# Patient Record
Sex: Female | Born: 1975 | Race: White | Hispanic: No | Marital: Single | Smoking: Current every day smoker
Health system: Southern US, Community
[De-identification: ages and names within clinical notes are randomized; demographics above are authoritative.]

## PROBLEM LIST (undated history)

## (undated) DIAGNOSIS — D649 Anemia, unspecified: Secondary | ICD-10-CM

## (undated) HISTORY — PX: WISDOM TOOTH EXTRACTION: SHX21

## (undated) HISTORY — PX: HERNIA REPAIR: SHX51

---

## 2018-09-08 ENCOUNTER — Other Ambulatory Visit: Payer: Self-pay

## 2018-09-08 ENCOUNTER — Encounter (HOSPITAL_COMMUNITY): Payer: Self-pay

## 2018-09-08 NOTE — Progress Notes (Signed)
Pre-op phone call complete. Pt denies a cardiac hx or being seen by a cardiologist. All questions and concerns have been addressed at this time.

## 2018-09-09 ENCOUNTER — Encounter (HOSPITAL_COMMUNITY)
Admission: RE | Disposition: A | Discharge status: Home / Self Care | Payer: Self-pay | Source: Ambulatory Visit | Attending: Orthopedic Surgery

## 2018-09-09 ENCOUNTER — Encounter (HOSPITAL_COMMUNITY): Payer: Self-pay

## 2018-09-09 ENCOUNTER — Ambulatory Visit (HOSPITAL_COMMUNITY): Payer: No Typology Code available for payment source

## 2018-09-09 ENCOUNTER — Ambulatory Visit (HOSPITAL_COMMUNITY): Payer: No Typology Code available for payment source | Admitting: Anesthesiology

## 2018-09-09 ENCOUNTER — Observation Stay (HOSPITAL_COMMUNITY)
Admission: RE | Admit: 2018-09-09 | Discharge: 2018-09-11 | Disposition: A | Discharge status: Home / Self Care | Payer: No Typology Code available for payment source | Source: Ambulatory Visit | Attending: Orthopedic Surgery | Admitting: Orthopedic Surgery

## 2018-09-09 DIAGNOSIS — S82122A Displaced fracture of lateral condyle of left tibia, initial encounter for closed fracture: Secondary | ICD-10-CM | POA: Diagnosis present

## 2018-09-09 DIAGNOSIS — Z79899 Other long term (current) drug therapy: Secondary | ICD-10-CM | POA: Diagnosis not present

## 2018-09-09 DIAGNOSIS — X58XXXA Exposure to other specified factors, initial encounter: Secondary | ICD-10-CM | POA: Diagnosis not present

## 2018-09-09 DIAGNOSIS — Y990 Civilian activity done for income or pay: Secondary | ICD-10-CM | POA: Diagnosis not present

## 2018-09-09 DIAGNOSIS — Z7982 Long term (current) use of aspirin: Secondary | ICD-10-CM | POA: Insufficient documentation

## 2018-09-09 DIAGNOSIS — Z419 Encounter for procedure for purposes other than remedying health state, unspecified: Secondary | ICD-10-CM

## 2018-09-09 DIAGNOSIS — F17210 Nicotine dependence, cigarettes, uncomplicated: Secondary | ICD-10-CM | POA: Diagnosis not present

## 2018-09-09 HISTORY — PX: ORIF TIBIA PLATEAU: SHX2132

## 2018-09-09 HISTORY — DX: Anemia, unspecified: D64.9

## 2018-09-09 LAB — CBC
HCT: 44.7 % (ref 36.0–46.0)
Hemoglobin: 14.4 g/dL (ref 12.0–15.0)
MCH: 28.7 pg (ref 26.0–34.0)
MCHC: 32.2 g/dL (ref 30.0–36.0)
MCV: 89 fL (ref 78.0–100.0)
Platelets: 292 10*3/uL (ref 150–400)
RBC: 5.02 MIL/uL (ref 3.87–5.11)
RDW: 13.8 % (ref 11.5–15.5)
WBC: 9.8 10*3/uL (ref 4.0–10.5)

## 2018-09-09 LAB — CREATININE, SERUM: CREATININE: 0.64 mg/dL (ref 0.44–1.00)

## 2018-09-09 LAB — HEMOGLOBIN: HEMOGLOBIN: 14.8 g/dL (ref 12.0–15.0)

## 2018-09-09 SURGERY — OPEN REDUCTION INTERNAL FIXATION (ORIF) TIBIAL PLATEAU
Anesthesia: General | Site: Leg Lower | Laterality: Left

## 2018-09-09 MED ORDER — DEXAMETHASONE SODIUM PHOSPHATE 10 MG/ML IJ SOLN
INTRAMUSCULAR | Status: DC | PRN
  Administered 2018-09-09: 10 mg via INTRAVENOUS

## 2018-09-09 MED ORDER — ACETAMINOPHEN 650 MG RE SUPP: 650.0000 mg | Freq: Four times a day (QID) | RECTAL | Status: DC | PRN

## 2018-09-09 MED ORDER — FENTANYL CITRATE (PF) 100 MCG/2ML IJ SOLN
25.0000 ug | INTRAMUSCULAR | Status: DC | PRN
  Administered 2018-09-09 (×2): 50 ug via INTRAVENOUS

## 2018-09-09 MED ORDER — METHOCARBAMOL 1000 MG/10ML IJ SOLN
500.0000 mg | Freq: Four times a day (QID) | INTRAVENOUS | Status: DC | PRN
  Filled 2018-09-09: qty 5

## 2018-09-09 MED ORDER — ONDANSETRON HCL 4 MG PO TABS: 4.0000 mg | ORAL_TABLET | Freq: Four times a day (QID) | ORAL | Status: DC | PRN

## 2018-09-09 MED ORDER — CLINDAMYCIN PHOSPHATE 900 MG/50ML IV SOLN
900.0000 mg | Freq: Once | INTRAVENOUS | Status: AC
  Administered 2018-09-09: 900 mg via INTRAVENOUS
  Filled 2018-09-09: qty 50

## 2018-09-09 MED ORDER — METHOCARBAMOL 500 MG PO TABS
ORAL_TABLET | ORAL | Status: AC
  Filled 2018-09-09: qty 1

## 2018-09-09 MED ORDER — 0.9 % SODIUM CHLORIDE (POUR BTL) OPTIME
TOPICAL | Status: DC | PRN
  Administered 2018-09-09: 1000 mL

## 2018-09-09 MED ORDER — ACETAMINOPHEN 325 MG PO TABS: 650.0000 mg | ORAL_TABLET | Freq: Four times a day (QID) | ORAL | Status: DC | PRN

## 2018-09-09 MED ORDER — KETOROLAC TROMETHAMINE 15 MG/ML IJ SOLN
INTRAMUSCULAR | Status: AC
  Filled 2018-09-09: qty 1

## 2018-09-09 MED ORDER — OXYCODONE HCL 5 MG PO TABS
ORAL_TABLET | ORAL | Status: AC
  Filled 2018-09-09: qty 2

## 2018-09-09 MED ORDER — METHOCARBAMOL 500 MG PO TABS
500.0000 mg | ORAL_TABLET | Freq: Four times a day (QID) | ORAL | Status: DC | PRN
  Administered 2018-09-09 – 2018-09-11 (×5): 500 mg via ORAL
  Filled 2018-09-09 (×4): qty 1

## 2018-09-09 MED ORDER — FENTANYL CITRATE (PF) 100 MCG/2ML IJ SOLN
INTRAMUSCULAR | Status: DC | PRN
  Administered 2018-09-09 (×3): 50 ug via INTRAVENOUS
  Administered 2018-09-09: 100 ug via INTRAVENOUS

## 2018-09-09 MED ORDER — KETOROLAC TROMETHAMINE 15 MG/ML IJ SOLN
15.0000 mg | Freq: Four times a day (QID) | INTRAMUSCULAR | Status: DC
  Administered 2018-09-09 – 2018-09-11 (×8): 15 mg via INTRAVENOUS
  Filled 2018-09-09 (×7): qty 1

## 2018-09-09 MED ORDER — ROCURONIUM BROMIDE 50 MG/5ML IV SOSY
PREFILLED_SYRINGE | INTRAVENOUS | Status: DC | PRN
  Administered 2018-09-09: 50 mg via INTRAVENOUS
  Administered 2018-09-09: 20 mg via INTRAVENOUS

## 2018-09-09 MED ORDER — SUGAMMADEX SODIUM 200 MG/2ML IV SOLN
INTRAVENOUS | Status: DC | PRN
  Administered 2018-09-09: 200 mg via INTRAVENOUS

## 2018-09-09 MED ORDER — PROMETHAZINE HCL 25 MG/ML IJ SOLN: 6.2500 mg | INTRAMUSCULAR | Status: DC | PRN

## 2018-09-09 MED ORDER — ONDANSETRON HCL 4 MG/2ML IJ SOLN
INTRAMUSCULAR | Status: AC
  Filled 2018-09-09: qty 2

## 2018-09-09 MED ORDER — ROCURONIUM BROMIDE 50 MG/5ML IV SOSY
PREFILLED_SYRINGE | INTRAVENOUS | Status: AC
  Filled 2018-09-09: qty 5

## 2018-09-09 MED ORDER — DOCUSATE SODIUM 100 MG PO CAPS
100.0000 mg | ORAL_CAPSULE | Freq: Two times a day (BID) | ORAL | Status: DC
  Administered 2018-09-09 – 2018-09-11 (×4): 100 mg via ORAL
  Filled 2018-09-09 (×4): qty 1

## 2018-09-09 MED ORDER — PROPOFOL 10 MG/ML IV BOLUS
INTRAVENOUS | Status: AC
  Filled 2018-09-09: qty 20

## 2018-09-09 MED ORDER — MIDAZOLAM HCL 2 MG/2ML IJ SOLN
INTRAMUSCULAR | Status: AC
  Filled 2018-09-09: qty 2

## 2018-09-09 MED ORDER — ENOXAPARIN SODIUM 40 MG/0.4ML ~~LOC~~ SOLN
40.0000 mg | SUBCUTANEOUS | Status: DC
  Administered 2018-09-10 – 2018-09-11 (×2): 40 mg via SUBCUTANEOUS
  Filled 2018-09-09 (×2): qty 0.4

## 2018-09-09 MED ORDER — ACETAMINOPHEN 500 MG PO TABS
ORAL_TABLET | ORAL | Status: AC
  Filled 2018-09-09: qty 2

## 2018-09-09 MED ORDER — SCOPOLAMINE 1 MG/3DAYS TD PT72
1.0000 | MEDICATED_PATCH | Freq: Once | TRANSDERMAL | Status: DC
  Administered 2018-09-09: 1.5 mg via TRANSDERMAL

## 2018-09-09 MED ORDER — FENTANYL CITRATE (PF) 250 MCG/5ML IJ SOLN
INTRAMUSCULAR | Status: AC
  Filled 2018-09-09: qty 5

## 2018-09-09 MED ORDER — MIDAZOLAM HCL 5 MG/5ML IJ SOLN
INTRAMUSCULAR | Status: DC | PRN
  Administered 2018-09-09: 2 mg via INTRAVENOUS

## 2018-09-09 MED ORDER — LIDOCAINE 2% (20 MG/ML) 5 ML SYRINGE
INTRAMUSCULAR | Status: DC | PRN
  Administered 2018-09-09: 80 mg via INTRAVENOUS

## 2018-09-09 MED ORDER — MORPHINE SULFATE (PF) 2 MG/ML IV SOLN
2.0000 mg | INTRAVENOUS | Status: DC | PRN
  Administered 2018-09-09 – 2018-09-11 (×2): 2 mg via INTRAVENOUS
  Filled 2018-09-09 (×3): qty 1

## 2018-09-09 MED ORDER — FENTANYL CITRATE (PF) 100 MCG/2ML IJ SOLN
INTRAMUSCULAR | Status: AC
  Filled 2018-09-09: qty 2

## 2018-09-09 MED ORDER — LIDOCAINE 2% (20 MG/ML) 5 ML SYRINGE
INTRAMUSCULAR | Status: AC
  Filled 2018-09-09: qty 5

## 2018-09-09 MED ORDER — PROPOFOL 10 MG/ML IV BOLUS
INTRAVENOUS | Status: DC | PRN
  Administered 2018-09-09: 150 mg via INTRAVENOUS
  Administered 2018-09-09: 30 mg via INTRAVENOUS

## 2018-09-09 MED ORDER — ONDANSETRON HCL 4 MG/2ML IJ SOLN
INTRAMUSCULAR | Status: DC | PRN
  Administered 2018-09-09: 4 mg via INTRAVENOUS

## 2018-09-09 MED ORDER — CHLORHEXIDINE GLUCONATE 4 % EX LIQD: 60.0000 mL | Freq: Once | CUTANEOUS | Status: DC

## 2018-09-09 MED ORDER — GABAPENTIN 300 MG PO CAPS
ORAL_CAPSULE | ORAL | Status: AC
  Administered 2018-09-09: 300 mg via ORAL
  Filled 2018-09-09: qty 1

## 2018-09-09 MED ORDER — GABAPENTIN 300 MG PO CAPS
300.0000 mg | ORAL_CAPSULE | Freq: Once | ORAL | Status: AC
  Administered 2018-09-09: 300 mg via ORAL

## 2018-09-09 MED ORDER — LACTATED RINGERS IV SOLN
INTRAVENOUS | Status: DC
  Administered 2018-09-09 (×2): via INTRAVENOUS

## 2018-09-09 MED ORDER — ACETAMINOPHEN 500 MG PO TABS: 1000.0000 mg | ORAL_TABLET | Freq: Once | ORAL | Status: DC

## 2018-09-09 MED ORDER — SCOPOLAMINE 1 MG/3DAYS TD PT72
MEDICATED_PATCH | TRANSDERMAL | Status: AC
  Administered 2018-09-09: 1.5 mg via TRANSDERMAL
  Filled 2018-09-09: qty 1

## 2018-09-09 MED ORDER — OXYCODONE HCL 5 MG PO TABS
5.0000 mg | ORAL_TABLET | ORAL | Status: DC | PRN
  Administered 2018-09-09 – 2018-09-11 (×12): 10 mg via ORAL
  Filled 2018-09-09 (×11): qty 2

## 2018-09-09 MED ORDER — ACETAMINOPHEN 500 MG PO TABS
1000.0000 mg | ORAL_TABLET | Freq: Four times a day (QID) | ORAL | Status: DC
  Administered 2018-09-09 – 2018-09-11 (×8): 1000 mg via ORAL
  Filled 2018-09-09 (×8): qty 2

## 2018-09-09 MED ORDER — ONDANSETRON HCL 4 MG/2ML IJ SOLN: 4.0000 mg | Freq: Four times a day (QID) | INTRAMUSCULAR | Status: DC | PRN

## 2018-09-09 MED ORDER — DEXAMETHASONE SODIUM PHOSPHATE 10 MG/ML IJ SOLN
INTRAMUSCULAR | Status: AC
  Filled 2018-09-09: qty 1

## 2018-09-09 SURGICAL SUPPLY — 65 items
ALCOHOL 70% 16 OZ (MISCELLANEOUS) ×3 IMPLANT
BANDAGE ACE 6X5 VEL STRL LF (GAUZE/BANDAGES/DRESSINGS) ×3 IMPLANT
BANDAGE ESMARK 6X9 LF (GAUZE/BANDAGES/DRESSINGS) ×1 IMPLANT
BIT DRILL 2.5 X LONG (BIT) ×1
BIT DRILL CALIBR QC 2.8X250 (BIT) ×3 IMPLANT
BIT DRILL X LONG 2.5 (BIT) ×1 IMPLANT
BLADE CLIPPER SURG (BLADE) IMPLANT
BNDG COHESIVE 6X5 TAN STRL LF (GAUZE/BANDAGES/DRESSINGS) ×3 IMPLANT
BNDG ELASTIC 6X10 VLCR STRL LF (GAUZE/BANDAGES/DRESSINGS) ×3 IMPLANT
BNDG ESMARK 6X9 LF (GAUZE/BANDAGES/DRESSINGS) ×3
BONE CHIP PRESERV 40CC PCAN1/2 (Bone Implant) ×3 IMPLANT
COVER SURGICAL LIGHT HANDLE (MISCELLANEOUS) ×3 IMPLANT
DRAPE C-ARM 42X72 X-RAY (DRAPES) ×3 IMPLANT
DRAPE C-ARMOR (DRAPES) ×3 IMPLANT
DRAPE HALF SHEET 40X57 (DRAPES) ×3 IMPLANT
DRAPE IMP U-DRAPE 54X76 (DRAPES) ×6 IMPLANT
DRAPE INCISE IOBAN 66X45 STRL (DRAPES) ×3 IMPLANT
DRAPE ORTHO SPLIT 77X108 STRL (DRAPES) ×4
DRAPE SURG ORHT 6 SPLT 77X108 (DRAPES) ×2 IMPLANT
DRAPE U-SHAPE 47X51 STRL (DRAPES) ×3 IMPLANT
DRILL BIT X LONG 2.5 (BIT) ×2
DURAPREP 26ML APPLICATOR (WOUND CARE) ×3 IMPLANT
ELECT REM PT RETURN 9FT ADLT (ELECTROSURGICAL) ×3
ELECTRODE REM PT RTRN 9FT ADLT (ELECTROSURGICAL) ×1 IMPLANT
FACESHIELD STD STERILE (MASK) ×3 IMPLANT
GAUZE SPONGE 4X4 12PLY STRL (GAUZE/BANDAGES/DRESSINGS) ×3 IMPLANT
GAUZE SPONGE 4X4 12PLY STRL LF (GAUZE/BANDAGES/DRESSINGS) ×3 IMPLANT
GAUZE XEROFORM 1X8 LF (GAUZE/BANDAGES/DRESSINGS) ×3 IMPLANT
GLOVE BIO SURGEON STRL SZ7.5 (GLOVE) ×3 IMPLANT
GLOVE BIOGEL PI IND STRL 8 (GLOVE) ×1 IMPLANT
GLOVE BIOGEL PI INDICATOR 8 (GLOVE) ×2
GOWN STRL REUS W/ TWL LRG LVL3 (GOWN DISPOSABLE) ×2 IMPLANT
GOWN STRL REUS W/ TWL XL LVL3 (GOWN DISPOSABLE) ×1 IMPLANT
GOWN STRL REUS W/TWL LRG LVL3 (GOWN DISPOSABLE) ×4
GOWN STRL REUS W/TWL XL LVL3 (GOWN DISPOSABLE) ×2
IMMOBILIZER KNEE 22 (SOFTGOODS) ×3 IMPLANT
KIT BASIN OR (CUSTOM PROCEDURE TRAY) ×3 IMPLANT
KIT TURNOVER KIT B (KITS) ×3 IMPLANT
MANIFOLD NEPTUNE II (INSTRUMENTS) ×3 IMPLANT
NDL SUT 6 .5 CRC .975X.05 MAYO (NEEDLE) IMPLANT
NEEDLE MAYO TAPER (NEEDLE)
NS IRRIG 1000ML POUR BTL (IV SOLUTION) ×3 IMPLANT
PACK GENERAL/GYN (CUSTOM PROCEDURE TRAY) ×3 IMPLANT
PAD ABD 8X10 STRL (GAUZE/BANDAGES/DRESSINGS) ×3 IMPLANT
PAD ARMBOARD 7.5X6 YLW CONV (MISCELLANEOUS) ×6 IMPLANT
PADDING CAST COTTON 6X4 STRL (CAST SUPPLIES) ×3 IMPLANT
PLATE PROX TIB LT 3.5 VA-LCP (Plate) ×3 IMPLANT
SCREW CORT ST STARDR 3.5X42 (Screw) ×3 IMPLANT
SCREW CORTEX LP 3.5X34MM (Screw) ×3 IMPLANT
SCREW HEADED ST LCP 3.5X80 (Screw) ×3 IMPLANT
SCREW LOCKING 3.5X80MM VA (Screw) ×3 IMPLANT
SCREW LOCKING VA 3.5X75MM (Screw) ×3 IMPLANT
SCREW VA-LOCKING 65MM 3.5 (Screw) ×3 IMPLANT
STAPLER VISISTAT 35W (STAPLE) ×6 IMPLANT
SUCTION FRAZIER TIP 10 FR DISP (SUCTIONS) ×3 IMPLANT
SUT ETHILON 2 0 FS 18 (SUTURE) IMPLANT
SUT ETHILON 3 0 PS 1 (SUTURE) IMPLANT
SUT PDS AB 0 CT 36 (SUTURE) IMPLANT
SUT VIC AB 0 CT1 27 (SUTURE) ×6
SUT VIC AB 0 CT1 27XBRD ANBCTR (SUTURE) ×3 IMPLANT
SUT VIC AB 2-0 CT1 27 (SUTURE)
SUT VIC AB 2-0 CT1 TAPERPNT 27 (SUTURE) IMPLANT
SYR 3ML LL SCALE MARK (SYRINGE) ×3 IMPLANT
TOWEL OR 17X24 6PK STRL BLUE (TOWEL DISPOSABLE) ×3 IMPLANT
TOWEL OR 17X26 10 PK STRL BLUE (TOWEL DISPOSABLE) ×6 IMPLANT

## 2018-09-09 NOTE — Anesthesia Procedure Notes (Signed)
Procedure Name: Intubation Date/Time: 09/09/2018 10:04 AM Performed by: Scheryl Darter, CRNA Pre-anesthesia Checklist: Patient identified, Emergency Drugs available, Suction available and Patient being monitored Patient Re-evaluated:Patient Re-evaluated prior to induction Oxygen Delivery Method: Circle System Utilized Preoxygenation: Pre-oxygenation with 100% oxygen Induction Type: IV induction Ventilation: Mask ventilation without difficulty Laryngoscope Size: Mac and 3 Grade View: Grade I Tube type: Oral Tube size: 7.0 mm Number of attempts: 1 Airway Equipment and Method: Stylet Placement Confirmation: ETT inserted through vocal cords under direct vision,  positive ETCO2 and breath sounds checked- equal and bilateral Secured at: 23 cm Tube secured with: Tape Dental Injury: Teeth and Oropharynx as per pre-operative assessment  Comments: Intubated by [facility]

## 2018-09-09 NOTE — Plan of Care (Signed)
  Problem: Nutrition: Goal: Adequate nutrition will be maintained Outcome: Progressing   Problem: Coping: Goal: Level of anxiety will decrease Outcome: Progressing   Problem: Elimination: Goal: Will not experience complications related to bowel motility Outcome: Progressing   Problem: Pain Managment: Goal: General experience of comfort will improve Outcome: Progressing   Problem: Safety: Goal: Ability to remain free from injury will improve Outcome: Progressing   

## 2018-09-09 NOTE — Brief Op Note (Signed)
09/09/2018  11:39 AM  PATIENT:  Maria Savage  42 y.o. female  PRE-OPERATIVE DIAGNOSIS:  Left lateral tibial plateau fracture  POST-OPERATIVE DIAGNOSIS:  Left lateral tibial plateau fracture  PROCEDURE:  Procedure(s): OPEN REDUCTION INTERNAL FIXATION (ORIF) TIBIAL PLATEAU (Left)  SURGEON:  Surgeon(s) and Role:    Yolonda Kida* Gurbani Figge Patrick, MD - Primary  PHYSICIAN ASSISTANT:   ASSISTANTS: April Green , RNFA    ANESTHESIA:   general  EBL:  25 mL   BLOOD ADMINISTERED:none  DRAINS: none   LOCAL MEDICATIONS USED:  NONE  SPECIMEN:  No Specimen  DISPOSITION OF SPECIMEN:  N/A  COUNTS:  YES  TOURNIQUET:  * Missing tourniquet times found for documented tourniquets in log: [id number] *  DICTATION: .Note written in EPIC  PLAN OF CARE: Admit for overnight observation  PATIENT DISPOSITION:  PACU - hemodynamically stable.   Delay start of Pharmacological VTE agent (>24hrs) due to surgical blood loss or risk of bleeding: not applicable

## 2018-09-09 NOTE — Anesthesia Postprocedure Evaluation (Signed)
Anesthesia Post Note  Patient: Maria Savage  Procedure(s) Performed: OPEN REDUCTION INTERNAL FIXATION (ORIF) TIBIAL PLATEAU (Left Leg Lower)     Patient location during evaluation: PACU Anesthesia Type: General Level of consciousness: awake and alert, awake and oriented Pain management: pain level controlled Vital Signs Assessment: post-procedure vital signs reviewed and stable Respiratory status: spontaneous breathing, nonlabored ventilation and respiratory function stable Cardiovascular status: blood pressure returned to baseline and stable Postop Assessment: no apparent nausea or vomiting Anesthetic complications: no    Last Vitals:  Vitals:   09/09/18 1255 09/09/18 1322  BP: (!) 143/76 140/88  Pulse: 63 68  Resp: 14 16  Temp: (!) 36.1 C 36.5 C  SpO2: 97% 96%    Last Pain:  Vitals:   09/09/18 1322  TempSrc: Oral  PainSc:                  Cecile HearingStephen Edward Turk

## 2018-09-09 NOTE — H&P (Signed)
ORTHOPAEDIC H&P  REQUESTING PHYSICIAN: Yolonda Kida, MD  PCP:  Patient, No Pcp Per  Chief Complaint: Left tibial plateau fracture  HPI: Maria Savage is a 42 y.o. female who complains of left knee pain following a work-related injury about 3 weeks prior to today's presentation.  She was evaluated by my partner where x-rays did demonstrate a depressed lateral tibial plateau fracture.  A CT scan demonstrated 9 mm of articular depression.  She is indicated for surgical fixation.  She presents today for that surgery.  She has no new complaints at this time.  She has no new questions.  We previously discussed this injury and surgical recommendation in the office.  Past Medical History:  Diagnosis Date  . Anemia    Past Surgical History:  Procedure Laterality Date  . CESAREAN SECTION     x3  . HERNIA REPAIR    . WISDOM TOOTH EXTRACTION     Social History   Socioeconomic History  . Marital status: Single    Spouse name: Not on file  . Number of children: Not on file  . Years of education: Not on file  . Highest education level: Not on file  Occupational History  . Not on file  Social Needs  . Financial resource strain: Not on file  . Food insecurity:    Worry: Not on file    Inability: Not on file  . Transportation needs:    Medical: Not on file    Non-medical: Not on file  Tobacco Use  . Smoking status: Current Every Day Smoker    Packs/day: 0.50  . Smokeless tobacco: Never Used  Substance and Sexual Activity  . Alcohol use: Not Currently  . Drug use: Not Currently  . Sexual activity: Not on file  Lifestyle  . Physical activity:    Days per week: Not on file    Minutes per session: Not on file  . Stress: Not on file  Relationships  . Social connections:    Talks on phone: Not on file    Gets together: Not on file    Attends religious service: Not on file    Active member of club or organization: Not on file    Attends meetings of clubs or  organizations: Not on file    Relationship status: Not on file  Other Topics Concern  . Not on file  Social History Narrative  . Not on file   History reviewed. No pertinent family history. Allergies  Allergen Reactions  . Penicillins Anaphylaxis, Hives, Itching and Other (See Comments)    Sweating Has patient had a PCN reaction causing immediate rash, facial/tongue/throat swelling, SOB or lightheadedness with hypotension: Unknown Has patient had a PCN reaction causing severe rash involving mucus membranes or skin necrosis: Unknown Has patient had a PCN reaction that required hospitalization: Unknown Has patient had a PCN reaction occurring within the last 10 years: No If all of the above answers are "NO", then may proceed with Cephalosporin use.    Prior to Admission medications   Medication Sig Start Date End Date Taking? Authorizing Provider  aspirin 81 MG chewable tablet Chew 81 mg by mouth daily.   Yes [provider]  docusate sodium (COLACE) 100 MG capsule Take 100 mg by mouth daily.   Yes [provider]  HYDROcodone-acetaminophen (NORCO/VICODIN) 5-325 MG tablet Take 1 tablet by mouth every 6 (six) hours as needed for moderate pain.   Yes [provider]  methocarbamol (ROBAXIN) 500  MG tablet Take 500 mg by mouth 3 (three) times daily.   Yes [provider]  Multiple Vitamins-Minerals (CENTRUM SILVER ULTRA WOMENS PO) Take 1 tablet by mouth daily.   Yes [provider]   No results found.  Positive ROS: All other systems have been reviewed and were otherwise negative with the exception of those mentioned in the HPI and as above.  Physical Exam: General: Alert, no acute distress Cardiovascular: No pedal edema Respiratory: No cyanosis, no use of accessory musculature GI: No organomegaly, abdomen is soft and non-tender Skin: No lesions in the area of chief complaint Neurologic: Sensation intact distally Psychiatric: Patient is  competent for consent with normal mood and affect Lymphatic: No axillary or cervical lymphadenopathy    Assessment: Closed left tibial plateau fracture, Schatzker type III  Plan: -Plan for open reduction internal fixation of the left lateral tibial plateau today.  We again reviewed the risk, benefits, indications, and postoperative course for this procedure.  These include but are not limited to bleeding, infection, damage to surrounding neurovascular structures, nonunion, malunion, painful hardware, need for future surgery, development of arthritis, and the blood clots, and the risk of anesthesia.  She has provided informed consent after all questions were solicited and answered. -We will plan on admitting her postoperatively for routine postoperative care and following up on her blood counts given her history of anemia.  She will be nonweightbearing for approximately 6 to 8 weeks postoperatively.    Yolonda KidaJason Patrick Rogers, MD Cell [phone]    09/09/2018 7:12 AM

## 2018-09-09 NOTE — Op Note (Signed)
Date of Surgery: 09/09/2018  INDICATIONS: Maria Savage is a 42 y.o.-year-old female who sustained a left tibial plateau fracture; she was indicated for open reduction and internal fixation due to the displaced nature of the articular fracture and came to the operating room today for this procedure. The patient did consent to the procedure after discussion of the risks and benefits.  PREOPERATIVE DIAGNOSIS: left tibial plateau fracture (lateral condyle).  POSTOPERATIVE DIAGNOSIS: Same.  PROCEDURE: left tibial plateau open reduction and internal fixation.  CPT [id number]   SURGEON: Maryan RuedJason P Aryan Sparks, M.D.  ASSIST: April Chilton SiGreen, RNFA, .  ANESTHESIA:  general  IV FLUIDS AND URINE: See anesthesia.  ESTIMATED BLOOD LOSS: 25 mL.  IMPLANTS: Synthes proximal tibial locking plate with 3.5 mm locking and nonlocking screws. Cancellous allograft.  DRAINS: None  Tourniquet: 80 minutes at 350 mmHg  COMPLICATIONS: None.  DESCRIPTION OF PROCEDURE: The patient was brought to the operating room and placed supine on the operating table.  The patient had been signed prior to the procedure and this was documented. The patient had the anesthesia placed by the anesthesiologist.  The prep verification and incision time-outs were performed to confirm that this was the correct patient, site, side and location. The patient had an SCD on the opposite lower extremity. The patient did receive antibiotics prior to the incision and was re-dosed during the procedure as needed at indicated intervals.  The patient had the lower extremity prepped and draped in the standard surgical fashion.  The bony landmarks were palpated and the incision was drawn with a marker.  The incision was taken down through the skin and subcutaneous tissue with the knife down to the fascia. The fascia was then incised in line with the skin incision, taking care to extend through Gerdy's tubercle. The fascia was then taken anteriorly and posteriorly off  of Gerdy's tubercle, and this exposed the joint capsule. The anterior compartment was also gently elevated from distal to proximal off of the tibia to expose the fracture, taking care to leave the fascia available for later repair.    The submeniscal arthrotomy was then performed, taking care to avoid any damage to the meniscus.  The meniscus itself was intact.  This arthrotomy was performed to better visualize the joint and the edge of the joint capsule itself was tagged with PDS tagging sutures for later closure. After reduction of articular fragments with a dental pick,  A cortical window was also made distally with a 2.5 mm drill and an osteotome. The cortical window was removed and then a bone tamp was used to elevate the articular surface. This was followed both under direct visualization of the joint as well as on AP and lateral fluoroscopic imaging to confirm that the joint was well reduced.   After the joint was elevated, the bone allograft was placed in the hole to fill the void.  The lateral plate was then placed on the apex of the fracture and this was also clamped down with the peri-articular clamp.  All screws were then placed in the standard fashion, first drilling, then measuring with a depth gauge, and then placing the screws on power and tightening by hand.  The most proximal locking screws were then all placed. The remaining distal cortical screws were then placed.  All screws were confirmed on both AP and lateral views including both the length and location. The wound was copiously irrigated with 3 liters of saline via cysto tubing. The capsule was then closed using #  1 Vicryl suture and then the deep fascia was closed with 0 Vicryl figure-of-eight interrupted suture. This was closed with no difficulty or tension in the anterior compartment. The subcutaneous layer was closed with 2-0 vicryl. The skin was then reapproximated with staples. The wounds were cleaned and dried a final time. A  sterile dressing was placed. The patient was then wrapped in an Ace and placed in a knee immobilizer. The patient was then transferred back to the bed and left the operating room in stable condition.  All sponge and instrument counts were correct.  POSTOPERATIVE PLAN: Ms. Mcnall will remain nonweightbearing on this leg for approximately 6 weeks; she will return for suture removal in 2 weeks.  We will transition her to a Bledsoe style brace at that appointment and begin range of motion and therapy but no weightbearing..  Ms. Hollars will receive DVT prophylaxis based on other medications, activity level, and risk ratio of bleeding to thrombosis.  She will take twice daily aspirin for DVT prophylaxis.  She will be admitted for observation overnight.

## 2018-09-09 NOTE — Anesthesia Preprocedure Evaluation (Signed)
Anesthesia Evaluation  Patient identified by MRN, date of birth, ID band Patient awake    Reviewed: Allergy & Precautions, NPO status , Patient's Chart, lab work & pertinent test results  Airway Mallampati: II  TM Distance: >3 FB Neck ROM: Full    Dental  (+) Teeth Intact, Dental Advisory Given   Pulmonary Current Smoker,    Pulmonary exam normal breath sounds clear to auscultation       Cardiovascular Exercise Tolerance: Good negative cardio ROS Normal cardiovascular exam Rhythm:Regular Rate:Normal     Neuro/Psych negative neurological ROS     GI/Hepatic negative GI ROS, Neg liver ROS,   Endo/Other  negative endocrine ROSObesity   Renal/GU negative Renal ROS     Musculoskeletal Left lateral tibial plateau fracture   Abdominal   Peds  Hematology negative hematology ROS (+)   Anesthesia Other Findings Day of surgery medications reviewed with the patient.  Reproductive/Obstetrics                             Anesthesia Physical Anesthesia Plan  ASA: II  Anesthesia Plan: General   Post-op Pain Management:    Induction: Intravenous  PONV Risk Score and Plan: 3 and Midazolam, Dexamethasone and Ondansetron  Airway Management Planned: Oral ETT  Additional Equipment:   Intra-op Plan:   Post-operative Plan: Extubation in OR  Informed Consent: I have reviewed the patients History and Physical, chart, labs and discussed the procedure including the risks, benefits and alternatives for the proposed anesthesia with the patient or authorized representative who has indicated his/her understanding and acceptance.   Dental advisory given  Plan Discussed with: CRNA  Anesthesia Plan Comments:         Anesthesia Quick Evaluation

## 2018-09-09 NOTE — Transfer of Care (Signed)
Immediate Anesthesia Transfer of Care Note  Patient: Maria Savage  Procedure(s) Performed: OPEN REDUCTION INTERNAL FIXATION (ORIF) TIBIAL PLATEAU (Left Leg Lower)  Patient Location: PACU  Anesthesia Type:General  Level of Consciousness: awake, alert , oriented and sedated  Airway & Oxygen Therapy: Patient Spontanous Breathing and Patient connected to face mask oxygen  Post-op Assessment: Report given to RN, Post -op Vital signs reviewed and stable and Patient moving all extremities  Post vital signs: Reviewed and stable  Last Vitals:  Vitals Value Taken Time  BP 156/85 09/09/2018 11:56 AM  Temp    Pulse 79 09/09/2018 12:03 PM  Resp 11 09/09/2018 12:03 PM  SpO2 96 % 09/09/2018 12:03 PM  Vitals shown include unvalidated device data.  Last Pain:  Vitals:   09/09/18 0747  TempSrc: Oral  PainSc: 0-No pain      Patients Stated Pain Goal: 2 (09/09/18 0747)  Complications: No apparent anesthesia complications

## 2018-09-09 NOTE — Discharge Instructions (Signed)
Discharge instructions: -No weightbearing to the left lower extremity. -Maintain your knee immobilizer when you are out of bed.  You may remove it while laying flat in bed. -Maintain postoperative bandage until your follow-up appointment.  If it become saturated you may remove it in place with a clean dry dressing. -For mild to moderate pain use Tylenol and/or Motrin.  For breakthrough pain use oxycodone as needed. -For the prevention of blood clots take a baby aspirin twice daily for 6 weeks. -Return to see Dr. Aundria Rudogers in 2 weeks for wound check and suture removal.

## 2018-09-10 DIAGNOSIS — S82122A Displaced fracture of lateral condyle of left tibia, initial encounter for closed fracture: Secondary | ICD-10-CM | POA: Diagnosis not present

## 2018-09-10 LAB — CBC
HCT: 42 % (ref 36.0–46.0)
HEMOGLOBIN: 13.6 g/dL (ref 12.0–15.0)
MCH: 29.2 pg (ref 26.0–34.0)
MCHC: 32.4 g/dL (ref 30.0–36.0)
MCV: 90.1 fL (ref 78.0–100.0)
PLATELETS: 360 10*3/uL (ref 150–400)
RBC: 4.66 MIL/uL (ref 3.87–5.11)
RDW: 13.8 % (ref 11.5–15.5)
WBC: 15 10*3/uL — ABNORMAL HIGH (ref 4.0–10.5)

## 2018-09-10 LAB — HIV ANTIBODY (ROUTINE TESTING W REFLEX): HIV SCREEN 4TH GENERATION: NONREACTIVE

## 2018-09-10 MED ORDER — ONDANSETRON 4 MG PO TBDP: 4.0000 mg | ORAL_TABLET | Freq: Three times a day (TID) | ORAL | 0 refills | Status: AC | PRN

## 2018-09-10 MED ORDER — OXYCODONE HCL 5 MG PO TABS: 5.0000 mg | ORAL_TABLET | ORAL | 0 refills | Status: AC | PRN

## 2018-09-10 NOTE — Plan of Care (Signed)
  Problem: Health Behavior/Discharge Planning: Goal: Ability to manage health-related needs will improve Outcome: Progressing   Problem: Activity: Goal: Risk for activity intolerance will decrease Outcome: Progressing   Problem: Coping: Goal: Level of anxiety will decrease Outcome: Progressing   Problem: Pain Managment: Goal: General experience of comfort will improve Outcome: Progressing   Problem: Safety: Goal: Ability to remain free from injury will improve Outcome: Progressing   Problem: Skin Integrity: Goal: Risk for impaired skin integrity will decrease Outcome: Progressing   

## 2018-09-10 NOTE — Plan of Care (Signed)
  Problem: Activity: Goal: Risk for activity intolerance will decrease Outcome: Progressing   Problem: Nutrition: Goal: Adequate nutrition will be maintained Outcome: Progressing   Problem: Coping: Goal: Level of anxiety will decrease Outcome: Progressing   Problem: Elimination: Goal: Will not experience complications related to bowel motility Outcome: Progressing   Problem: Pain Managment: Goal: General experience of comfort will improve Outcome: Progressing   

## 2018-09-10 NOTE — Evaluation (Signed)
Physical Therapy Evaluation Patient Details Name: Maria ChessmanCynthia Savage MRN: [id number] DOB: 24-May-1976 Today's Date: 09/10/2018   History of Present Illness  Pt is a 42 y/o female who presents s/p ORIF of a tibial plateau fracture that occured ~3 weeks ago at work. She is now NWB on the LLE.   Clinical Impression  Pt admitted with above diagnosis. Pt currently with functional limitations due to the deficits listed below (see PT Problem List). At the time of PT eval pt was able to perform bed mobility and transfers with modified independence, and ambulation with min guard assist and RW for support. Pt reports concerns that she will not be able to get up the stairs to the mobile home she is staying at, despite the extremely lengthy explanation she provided describing how she will complete this task. PT will continue to follow for gait and stair training until d/c. Pt will benefit from skilled PT to increase their independence and safety with mobility to allow discharge to the venue listed below.       Follow Up Recommendations Outpatient PT;Supervision for mobility/OOB    Equipment Recommendations  3in1 (PT)    Recommendations for Other Services       Precautions / Restrictions Precautions Precautions: Fall Precaution Comments: Pt has been wearing a knee immobilizer at all times, stating it helps her remember her precautions. However, no actual order for the knee immobilizer, and pt was educated to take it off when she is not mobilizing to allow the knee to get range of motion Required Braces or Orthoses: Knee Immobilizer - Left Restrictions Weight Bearing Restrictions: Yes LLE Weight Bearing: Non weight bearing      Mobility  Bed Mobility Overal bed mobility: Modified Independent             General bed mobility comments: HOB elevated. However, if HOB was flat still feel she would be mod I  Transfers Overall transfer level: Modified independent Equipment used: Rolling walker (2  wheeled)             General transfer comment: Pt demonstrated proper hand placement on seated surface for safety. No assist required to power-up to full stand.   Ambulation/Gait Ambulation/Gait assistance: Min guard Gait Distance (Feet): 35 Feet Assistive device: Rolling walker (2 wheeled) Gait Pattern/deviations: Step-to pattern;Decreased stride length;Trunk flexed Gait velocity: Decreased Gait velocity interpretation: <1.31 ft/sec, indicative of household ambulator General Gait Details: VC's for sequencing and general safety with the RW. Hop-to gait pattern due to NWB status.   Stairs            Wheelchair Mobility    Modified Rankin (Stroke Patients Only)       Balance Overall balance assessment: Mild deficits observed, not formally tested                                           Pertinent Vitals/Pain Pain Assessment: Faces Faces Pain Scale: Hurts little more Pain Location: L lower leg.     Home Living Family/patient expects to be discharged to:: Private residence Living Arrangements: Spouse/significant other;Other relatives Available Help at Discharge: Family;Available PRN/intermittently Type of Home: Mobile home Home Access: Stairs to enter Entrance Stairs-Rails: Left;Right;Can reach both Entrance Stairs-Number of Steps: 3 Home Layout: One level Home Equipment: Walker - 2 wheels;Wheelchair - manual      Prior Function Level of Independence: Needs assistance   Gait /  Transfers Assistance Needed: Wheelchair for commuinity ambulation, and RW around the house  ADL's / Homemaking Assistance Needed: Assist getting in and out of the shower from husband. Was working prior to this injury. She was able to bathe and dress herself without assist.         Hand Dominance        Extremity/Trunk Assessment   Upper Extremity Assessment Upper Extremity Assessment: Overall WFL for tasks assessed    Lower Extremity Assessment Lower  Extremity Assessment: LLE deficits/detail LLE Deficits / Details: Decreased strength and AROM consistent with above mentioned procedure.     Cervical / Trunk Assessment Cervical / Trunk Assessment: Normal  Communication   Communication: No difficulties  Cognition Arousal/Alertness: Awake/alert Behavior During Therapy: WFL for tasks assessed/performed;Restless Overall Cognitive Status: Within Functional Limits for tasks assessed                                        General Comments      Exercises     Assessment/Plan    PT Assessment Patient needs continued PT services  PT Problem List Decreased strength;Decreased range of motion;Decreased activity tolerance;Decreased balance;Decreased mobility;Decreased knowledge of use of DME;Decreased safety awareness;Decreased knowledge of precautions;Pain       PT Treatment Interventions Stair training;DME instruction;Gait training;Functional mobility training;Therapeutic activities;Therapeutic exercise;Neuromuscular re-education;Patient/family education;Wheelchair mobility training    PT Goals (Current goals can be found in the Care Plan section)  Acute Rehab PT Goals Patient Stated Goal: Home tomorrow PT Goal Formulation: With patient/family Time For Goal Achievement: 09/17/18 Potential to Achieve Goals: Good    Frequency Min 5X/week   Barriers to discharge        Co-evaluation               AM-PAC PT "6 Clicks" Daily Activity  Outcome Measure Difficulty turning over in bed (including adjusting bedclothes, sheets and blankets)?: None Difficulty moving from lying on back to sitting on the side of the bed? : None Difficulty sitting down on and standing up from a chair with arms (e.g., wheelchair, bedside commode, etc,.)?: None Help needed moving to and from a bed to chair (including a wheelchair)?: A Little Help needed walking in hospital room?: A Little Help needed climbing 3-5 steps with a railing? : A  Little 6 Click Score: 21    End of Session Equipment Utilized During Treatment: Gait belt;Left knee immobilizer Activity Tolerance: Patient tolerated treatment well Patient left: in chair;with call bell/phone within reach;with nursing/sitter in room;with family/visitor present Nurse Communication: Mobility status PT Visit Diagnosis: Unsteadiness on feet (R26.81);Pain;Difficulty in walking, not elsewhere classified (R26.2) Pain - Right/Left: Left Pain - part of body: Leg    Time: 1610-9604 PT Time Calculation (min) (ACUTE ONLY): 27 min   Charges:   PT Evaluation $PT Eval Moderate Complexity: 1 Mod PT Treatments $Gait Training: 8-22 mins        Conni Slipper, PT, DPT Acute Rehabilitation Services Pager: [phone] Office: [phone]   Marylynn Pearson 09/10/2018, 3:01 PM

## 2018-09-10 NOTE — Progress Notes (Signed)
   Subjective:  Patient reports pain as moderate.  Doing okay this morning.  She has sharp pain in the leg as expected.  She denies shortness of breath, chest pain, nausea or vomiting.  She states the IV morphine is still necessary.  Objective:   VITALS:   Vitals:   09/09/18 1322 09/09/18 1827 09/09/18 1956 09/10/18 0502  BP: 140/88 136/76 131/71 132/79  Pulse: 68 69 90 64  Resp: 16 18    Temp: 97.7 F (36.5 C) 98.1 F (36.7 C) 98.3 F (36.8 C) 98.2 F (36.8 C)  TempSrc: Oral Oral Oral Oral  SpO2: 96% 98% 95% 99%  Weight:      Height:        Neurovascular intact Sensation intact distally Intact pulses distally Dorsiflexion/Plantar flexion intact Incision: dressing C/D/I Compartment soft No pain with active or passive stretch of the foot and ankle   Lab Results  Component Value Date   WBC 15.0 (H) 09/10/2018   HGB 13.6 09/10/2018   HCT 42.0 09/10/2018   MCV 90.1 09/10/2018   PLT 360 09/10/2018   BMET    Component Value Date/Time   CREATININE 0.64 09/09/2018 1322   GFRNONAA >60 09/09/2018 1322   GFRAA >60 09/09/2018 1322     Assessment/Plan: 1 Day Post-Op   Active Problems:   Closed fracture of lateral portion of left tibial plateau   Advance diet Up with therapy -Nonweightbearing to the left lower extremity -Lovenox and SCD for DVT prophylaxis while inpatient. -Plan for 1 more night for optimization of pain control and to be able to discontinue IV for breakthrough. -Discharge home tomorrow.   Maria KidaJason Patrick Deshane Savage 09/10/2018, 9:05 AM   Maria RuedJason P Elanora Quin, MD [phone]

## 2018-09-11 DIAGNOSIS — S82122A Displaced fracture of lateral condyle of left tibia, initial encounter for closed fracture: Secondary | ICD-10-CM | POA: Diagnosis not present

## 2018-09-11 MED ORDER — ASPIRIN 81 MG PO CHEW: 81.0000 mg | CHEWABLE_TABLET | Freq: Two times a day (BID) | ORAL | 0 refills | Status: AC

## 2018-09-11 NOTE — Progress Notes (Signed)
Orthopedics Progress Note  Subjective: Patient reports pain under control and she is ready to go home.  Objective:  Vitals:   09/10/18 1952 09/11/18 0432  BP: 115/70 (!) 154/82  Pulse: (!) 59 (!) 50  Resp:    Temp: 98.2 F (36.8 C) 98.5 F (36.9 C)  SpO2: 98% 98%    General: Awake and alert  Musculoskeletal: Right leg bandaged with no drainage. Compartments supple with no cords. Neg Homan's No pain with active ankle pumps Neurovascularly intact  Lab Results  Component Value Date   WBC 15.0 (H) 09/10/2018   HGB 13.6 09/10/2018   HCT 42.0 09/10/2018   MCV 90.1 09/10/2018   PLT 360 09/10/2018       Component Value Date/Time   CREATININE 0.64 09/09/2018 1322   GFRNONAA >60 09/09/2018 1322   GFRAA >60 09/09/2018 1322    No results found for: INR, PROTIME  Assessment/Plan: POD #2 s/p Procedure(s): OPEN REDUCTION INTERNAL FIXATION (ORIF) TIBIAL PLATEAU Discharge to home NWB on the right LE Follow up with Dr Aundria Rudogers in the office  Viviann SpareSteven R. Ranell PatrickNorris, MD 09/11/2018 9:43 AM

## 2018-09-11 NOTE — Progress Notes (Signed)
Physical Therapy Treatment Patient Details Name: Maria ChessmanCynthia Frees MRN: [id number] DOB: 09-Sep-1976 Today's Date: 09/11/2018    History of Present Illness Pt is a 42 y/o female who presents s/p ORIF of a tibial plateau fracture that occured ~3 weeks ago at work. She is now NWB on the LLE.     PT Comments    Plan is for d/c home today. All education complete. Pt has all needed home equipment. PT signing off.   Follow Up Recommendations  Outpatient PT;Supervision for mobility/OOB(OPPT after 2-week follow-up appt with surgeon)     Equipment Recommendations  None recommended by PT    Recommendations for Other Services       Precautions / Restrictions Precautions Precautions: Fall Required Braces or Orthoses: Knee Immobilizer - Left Knee Immobilizer - Left: (no wear schedule noted) Restrictions Weight Bearing Restrictions: Yes LLE Weight Bearing: Non weight bearing    Mobility  Bed Mobility Overal bed mobility: Modified Independent                Transfers Overall transfer level: Modified independent Equipment used: Rolling walker (2 wheeled)             General transfer comment: Pt demonstrates safe technique.  Ambulation/Gait Ambulation/Gait assistance: Supervision Gait Distance (Feet): 50 Feet Assistive device: Rolling walker (2 wheeled) Gait Pattern/deviations: Step-to pattern Gait velocity: Decreased Gait velocity interpretation: <1.31 ft/sec, indicative of household ambulator General Gait Details: steady gait, pt able to maintain NWB LLE   Stairs Stairs: (verbally reviewed stairs. Pt reports she has been bumping up/down steps on her bottom. This is her preferred method. She was able to verbally state proper technique without cueing or assist. )           Wheelchair Mobility    Modified Rankin (Stroke Patients Only)       Balance Overall balance assessment: Mild deficits observed, not formally tested                                           Cognition Arousal/Alertness: Awake/alert Behavior During Therapy: WFL for tasks assessed/performed Overall Cognitive Status: Within Functional Limits for tasks assessed                                        Exercises General Exercises - Lower Extremity Ankle Circles/Pumps: AROM;Both;10 reps Quad Sets: AROM;Left;10 reps    General Comments        Pertinent Vitals/Pain Pain Assessment: 0-10 Pain Score: 3  Pain Location: LLE Pain Descriptors / Indicators: Sore Pain Intervention(s): Monitored during session    Home Living                      Prior Function            PT Goals (current goals can now be found in the care plan section) Acute Rehab PT Goals Patient Stated Goal: home today PT Goal Formulation: With patient/family Time For Goal Achievement: 09/17/18 Potential to Achieve Goals: Good Progress towards PT goals: Progressing toward goals    Frequency    Min 5X/week      PT Plan Equipment recommendations need to be updated    Co-evaluation              AM-PAC PT "6 Clicks" Daily Activity  Outcome Measure  Difficulty turning over in bed (including adjusting bedclothes, sheets and blankets)?: None Difficulty moving from lying on back to sitting on the side of the bed? : None Difficulty sitting down on and standing up from a chair with arms (e.g., wheelchair, bedside commode, etc,.)?: None Help needed moving to and from a bed to chair (including a wheelchair)?: None Help needed walking in hospital room?: None Help needed climbing 3-5 steps with a railing? : A Little 6 Click Score: 23    End of Session Equipment Utilized During Treatment: Gait belt;Left knee immobilizer Activity Tolerance: Patient tolerated treatment well Patient left: in chair;with call bell/phone within reach;with family/visitor present Nurse Communication: Mobility status PT Visit Diagnosis: Unsteadiness on feet  (R26.81);Pain;Difficulty in walking, not elsewhere classified (R26.2) Pain - Right/Left: Left Pain - part of body: Leg     Time: 5956-3875 PT Time Calculation (min) (ACUTE ONLY): 13 min  Charges:  $Gait Training: 8-22 mins                     Aida Raider, PT  Office # [phone] Pager [phone]    Ilda Foil 09/11/2018, 10:58 AM

## 2018-09-11 NOTE — Plan of Care (Signed)

## 2018-09-11 NOTE — Discharge Summary (Signed)
Orthopedic Discharge Summary        Physician Discharge Summary  Patient ID: Maria ChessmanCynthia Finger MRN: [id number] DOB/AGE: [birth year] 42 y.o.  Admit date: 09/09/2018 Discharge date: 09/11/2018   Procedures:  Procedure(s) (LRB): OPEN REDUCTION INTERNAL FIXATION (ORIF) TIBIAL PLATEAU (Left)  Attending Physician:  Dr. Malon KindleSteven Giannah Zavadil  Admission Diagnoses:   Displaced tibial plateau fracture, left   Discharge Diagnoses:  same   Past Medical History:  Diagnosis Date  . Anemia     PCP: Patient, No Pcp Per   Discharged Condition: good  Hospital Course:  Patient underwent the above stated procedure on 09/09/2018. Patient tolerated the procedure well and brought to the recovery room in good condition and subsequently to the floor. Patient had an uncomplicated hospital course and was stable for discharge. Patient is non weight bearing on the left LE   Disposition: Discharge disposition: 01-Home or Self Care      with follow up in 2 weeks   Follow-up Information    Yolonda Kidaogers, Jason Patrick, MD.   Specialty:  Orthopedic Surgery Why:  For suture removal, For wound re-check Contact information: [address] [place] [address] [phone]           Discharge Instructions    Call MD / Call 911   Complete by:  As directed    If you experience chest pain or shortness of breath, CALL 911 and be transported to the hospital emergency room.  If you develope a fever above 101 F, pus (white drainage) or increased drainage or redness at the wound, or calf pain, call your surgeon's office.   Constipation Prevention   Complete by:  As directed    Drink plenty of fluids.  Prune juice may be helpful.  You may use a stool softener, such as Colace (over the counter) 100 mg twice a day.  Use MiraLax (over the counter) for constipation as needed.   Diet - low sodium heart healthy   Complete by:  As directed    Increase activity slowly as tolerated   Complete by:  As  directed       Allergies as of 09/11/2018      Reactions   Penicillins Anaphylaxis, Hives, Itching, Other (See Comments)   Sweating Has patient had a PCN reaction causing immediate rash, facial/tongue/throat swelling, SOB or lightheadedness with hypotension: Unknown Has patient had a PCN reaction causing severe rash involving mucus membranes or skin necrosis: Unknown Has patient had a PCN reaction that required hospitalization: Unknown Has patient had a PCN reaction occurring within the last 10 years: No If all of the above answers are "NO", then may proceed with Cephalosporin use.      Medication List    STOP taking these medications   HYDROcodone-acetaminophen 5-325 MG tablet Commonly known as:  NORCO/VICODIN     TAKE these medications   aspirin 81 MG chewable tablet Chew 1 tablet (81 mg total) by mouth 2 (two) times daily. What changed:  when to take this   CENTRUM SILVER ULTRA WOMENS PO Take 1 tablet by mouth daily.   docusate sodium 100 MG capsule Commonly known as:  COLACE Take 100 mg by mouth daily.   methocarbamol 500 MG tablet Commonly known as:  ROBAXIN Take 500 mg by mouth 3 (three) times daily.   ondansetron 4 MG disintegrating tablet Commonly known as:  ZOFRAN-ODT Take 1 tablet (4 mg total) by mouth every 8 (eight) hours as needed.   oxyCODONE 5 MG immediate release tablet  Commonly known as:  Oxy IR/ROXICODONE Take 1-2 tablets (5-10 mg total) by mouth every 4 (four) hours as needed for severe pain or breakthrough pain.         Signed: Verlee Rossetti 09/11/2018, 11:10 AM  [facility] is now [employer] [facility] [employer] [address], [address], [place], [place] [address] Phone: [phone] Facebook  Instagram  [employer]

## 2018-09-11 NOTE — Progress Notes (Signed)
Provided discharge education/instructions, all questions and concerns addressed, discharged home with belongings accompanied by husband. 

## 2018-09-12 ENCOUNTER — Encounter (HOSPITAL_COMMUNITY): Payer: Self-pay | Admitting: Orthopedic Surgery

## 2018-09-16 ENCOUNTER — Encounter (HOSPITAL_COMMUNITY): Payer: Self-pay | Admitting: Orthopedic Surgery

## 2019-02-07 IMAGING — RF DG C-ARM 61-120 MIN
1 series · 7 of 7 positions shown · non-contrast
Comparison: None.

CLINICAL DATA: Open reduction and internal fixation of left tibial
plateau fracture.

EXAM:
LEFT KNEE - COMPLETE 4+ VIEW; DG C-ARM 61-120 MIN
FLUOROSCOPY TIME:  1 minutes 49 seconds.

[Series 1: run · 7 of 7 slices shown]
[im 1/7]
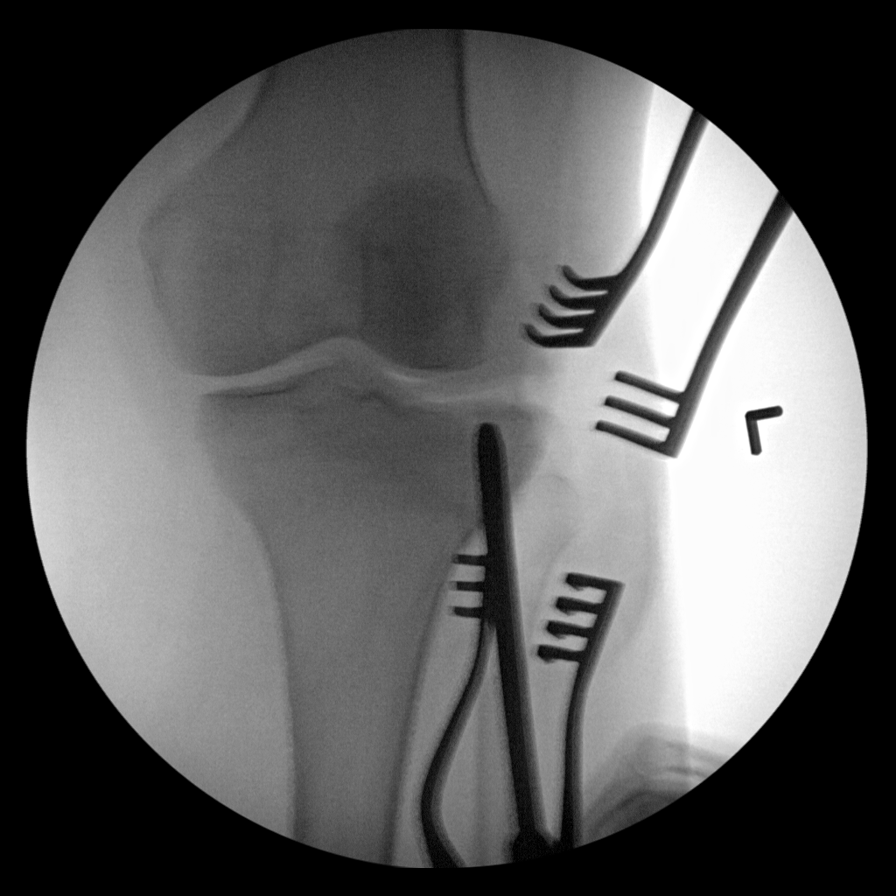
[im 2/7]
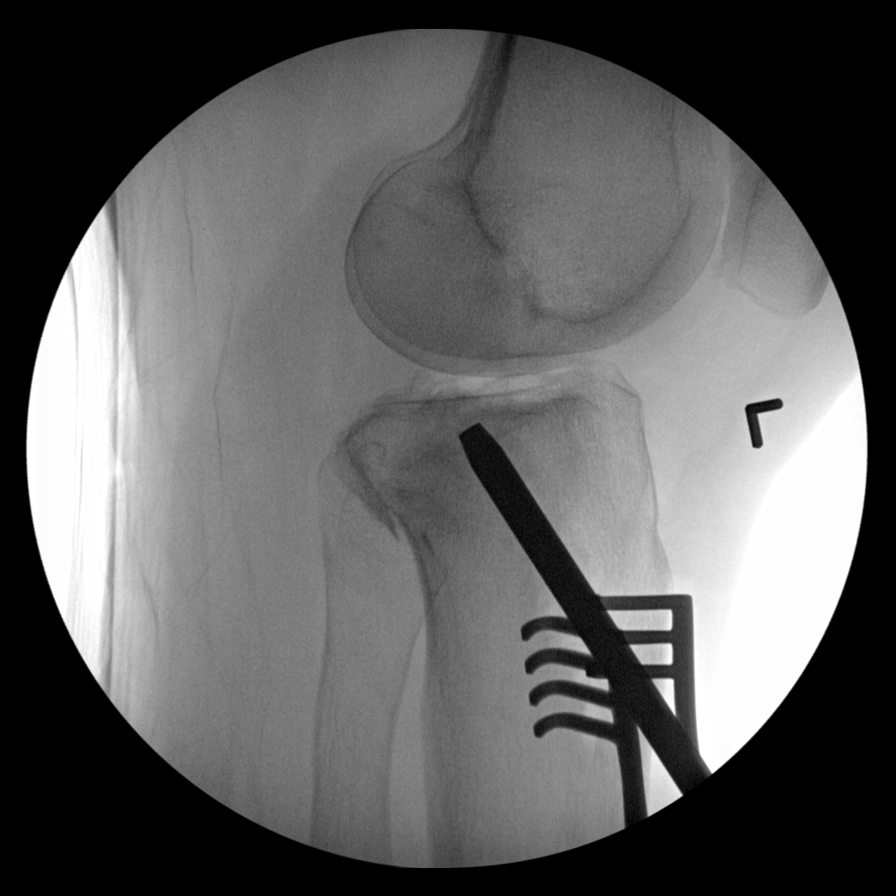
[im 3/7]
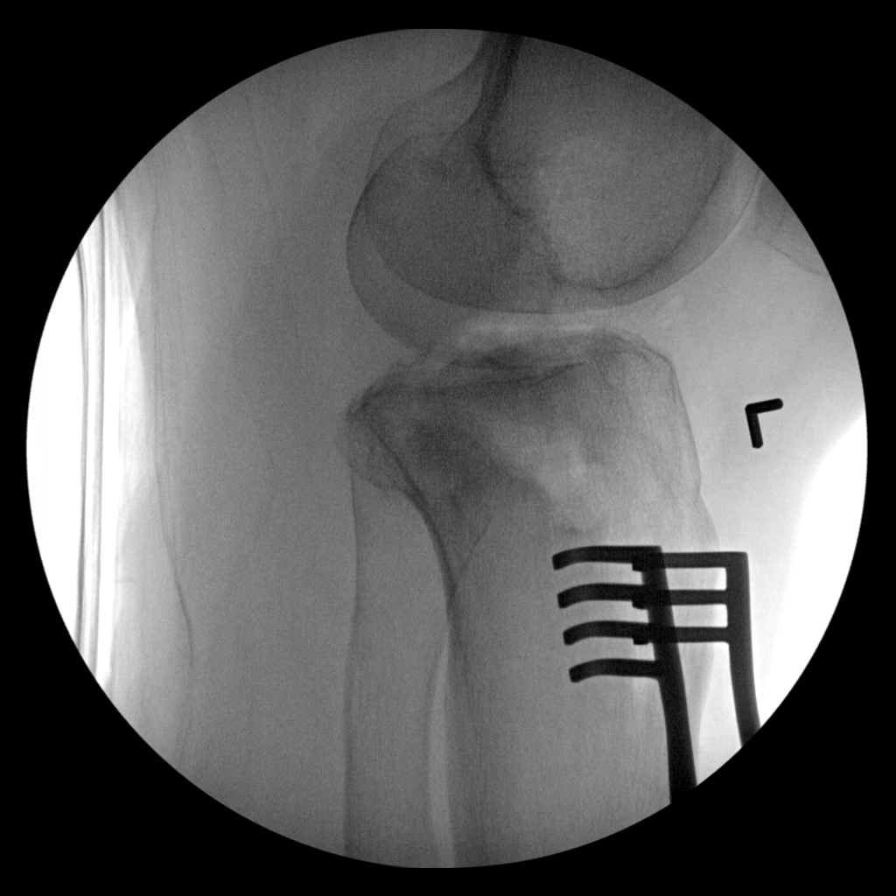
[im 4/7]
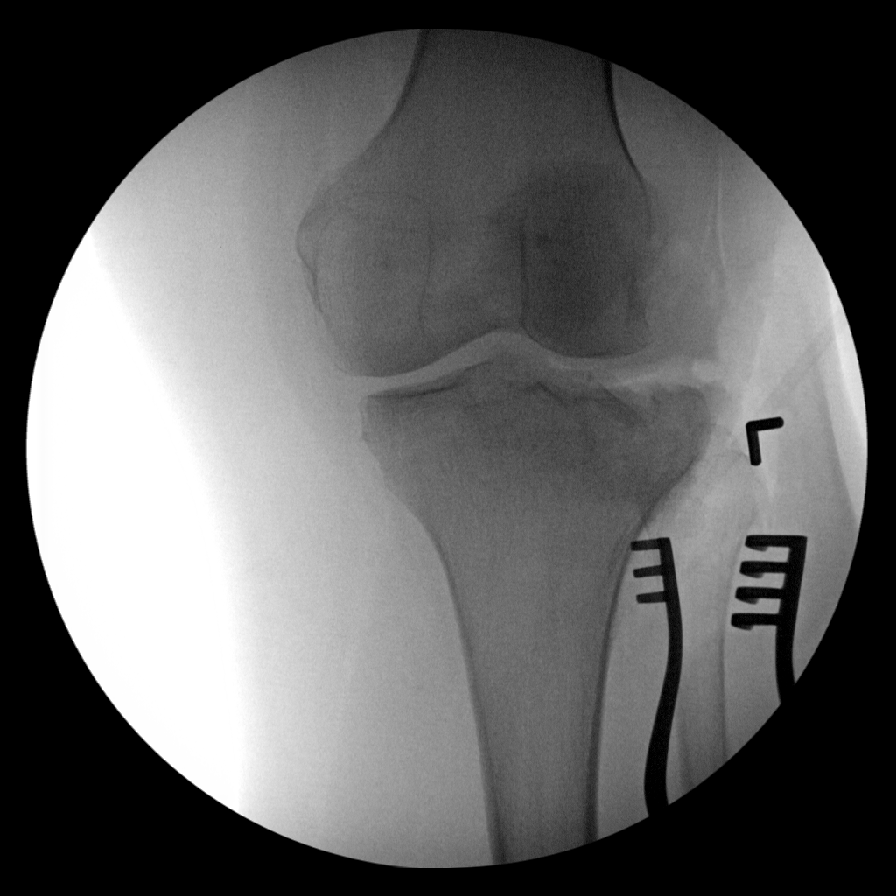
[im 5/7]
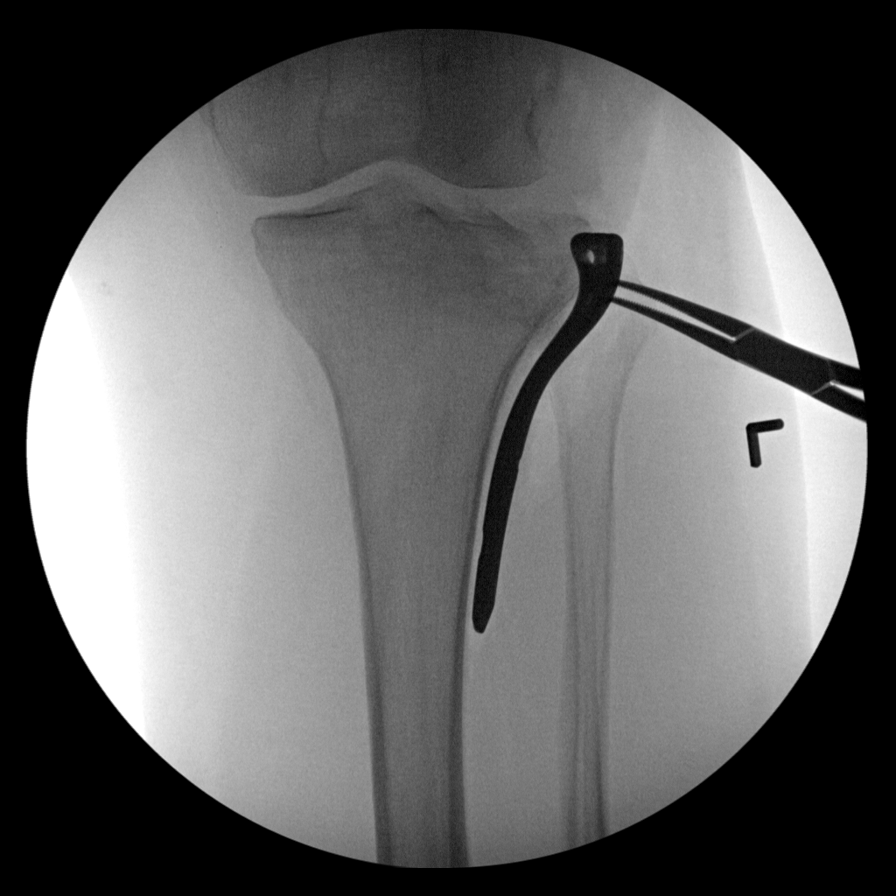
[im 6/7]
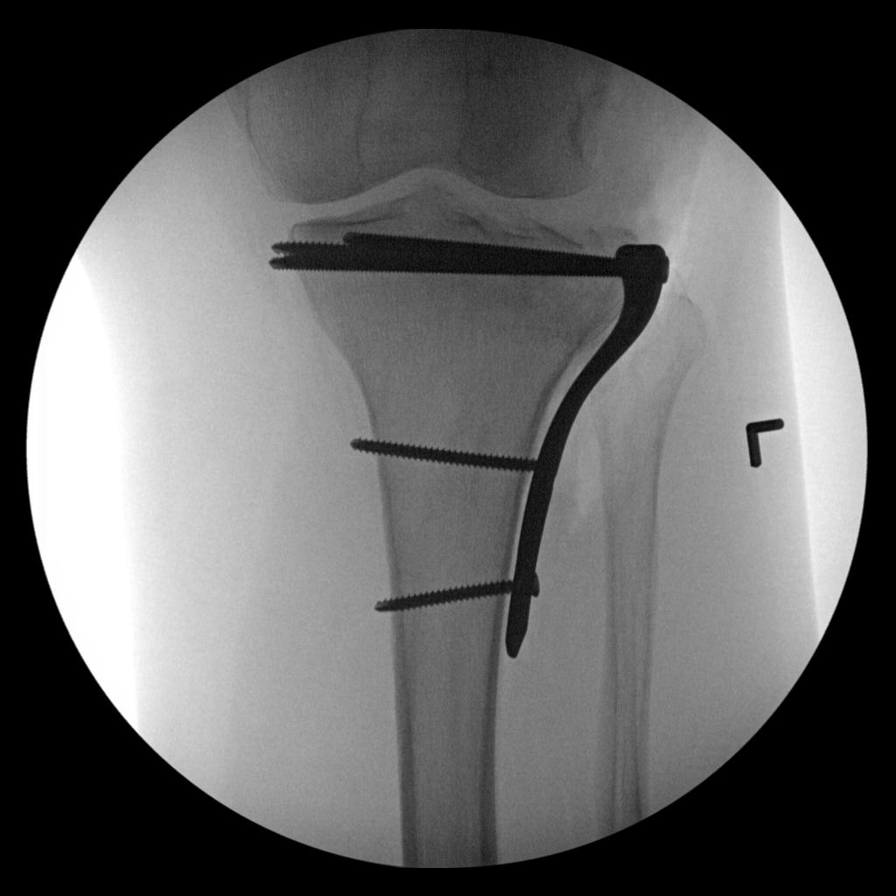
[im 7/7]
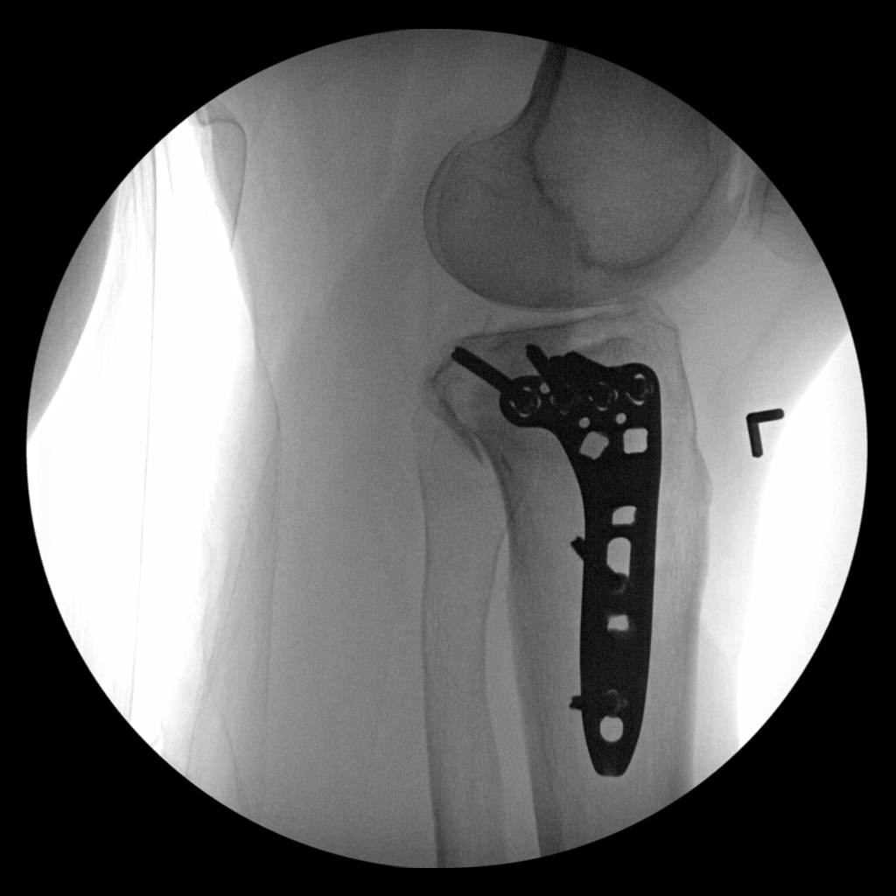

[7 of 7 positions shown; findings below may reference images not displayed]

FINDINGS: Seven intraoperative fluoroscopic images were obtained of the left
knee. These images demonstrate surgical internal fixation of lateral
tibial plateau fracture. Good alignment of fracture components is
noted.
IMPRESSION: Status post surgical internal fixation of lateral tibial plateau
fracture.
